# Patient Record
Sex: Male | Born: 1995 | Race: White | Hispanic: No | Marital: Single | Smoking: Never smoker
Health system: Southern US, Community
[De-identification: ages and names within clinical notes are randomized; demographics above are authoritative.]

## PROBLEM LIST (undated history)

## (undated) DIAGNOSIS — I861 Scrotal varices: Secondary | ICD-10-CM

## (undated) DIAGNOSIS — J45909 Unspecified asthma, uncomplicated: Secondary | ICD-10-CM

## (undated) HISTORY — DX: Scrotal varices: I86.1

## (undated) HISTORY — PX: WISDOM TOOTH EXTRACTION: SHX21

## (undated) HISTORY — DX: Unspecified asthma, uncomplicated: J45.909

## (undated) HISTORY — PX: OTHER SURGICAL HISTORY: SHX169

---

## 2015-08-02 ENCOUNTER — Encounter: Payer: Self-pay | Admitting: Urology

## 2015-08-02 ENCOUNTER — Ambulatory Visit (INDEPENDENT_AMBULATORY_CARE_PROVIDER_SITE_OTHER): Payer: BLUE CROSS/BLUE SHIELD | Admitting: Urology

## 2015-08-02 VITALS — BP 107/69 | HR 75 | Ht 65.0 in | Wt 121.6 lb

## 2015-08-02 DIAGNOSIS — N5082 Scrotal pain: Secondary | ICD-10-CM

## 2015-08-02 DIAGNOSIS — I861 Scrotal varices: Secondary | ICD-10-CM

## 2015-08-02 NOTE — Progress Notes (Signed)
08/02/2015 3:14 PM   Ralph Hardy 23-Jul-1995 [id number]  Referring provider: Dr. Noralee Stain  Chief Complaint  Patient presents with  . New Patient (Initial Visit)    Varicocele    HPI: Patient is a 20 year old Caucasian male who is referred to [place] by his PCP, Dr. Andrey Spearman, for a varicocele.    Patient states that he has had a right varicocele for a number of years. He states that he was initially told that he had a varicocele during a high school physical.  It has not caused him much difficulty until recently.  He is finding that the pain is occurring on a more frequent basis.  He has not noted any scrotal swelling. He states when he is walking for long periods of time or exercising, the pain is increased. On a few occasions, he is had to lay down for a few hours in his room until the pain abated.   He denies any penile discharge, dysuria, gross hematuria or suprapubic pain.  He is not having any urinary symptoms.  He does not have difficulty with erections.  He does not have pain or discomfort sedation of his semen upon ejaculation.    Recently, he states that he has been noting tenderness in the left scrotum.  PMH: Past Medical History  Diagnosis Date  . Asthma     Surgical History: Past Surgical History  Procedure Laterality Date  . Wisdom tooth extraction    . Cyst wrist      Home Medications:    Medication List    Notice  As of 08/02/2015  3:14 PM   You have not been prescribed any medications.      Allergies:  Allergies  Allergen Reactions  . Other Shortness Of Breath    Cat Dander  . Peanut Oil Rash    Family History: Family History  Problem Relation Age of Onset  . Hematuria Neg Hx   . Prostate cancer    . Sickle cell anemia Neg Hx     Social History:  reports that he has never smoked. He does not have any smokeless tobacco history on file. He reports that he drinks alcohol. He reports that he does not use illicit  drugs.  ROS: UROLOGY Frequent Urination?: No Hard to postpone urination?: No Burning/pain with urination?: No Get up at night to urinate?: No Leakage of urine?: No Urine stream starts and stops?: No Trouble starting stream?: No Do you have to strain to urinate?: No Blood in urine?: No Urinary tract infection?: No Sexually transmitted disease?: No Injury to kidneys or bladder?: No Painful intercourse?: No Weak stream?: No Erection problems?: No Penile pain?: No  Gastrointestinal Nausea?: No Vomiting?: No Indigestion/heartburn?: No Diarrhea?: No Constipation?: No  Constitutional Fever: No Night sweats?: No Weight loss?: No Fatigue?: No  Skin Skin rash/lesions?: No Itching?: No  Eyes Blurred vision?: No Double vision?: No  Ears/Nose/Throat Sore throat?: No Sinus problems?: No  Hematologic/Lymphatic Swollen glands?: No Easy bruising?: No  Cardiovascular Leg swelling?: No Chest pain?: No  Respiratory Cough?: No Shortness of breath?: No  Endocrine Excessive thirst?: No  Musculoskeletal Back pain?: No Joint pain?: No  Neurological Headaches?: No Dizziness?: No  Psychologic Depression?: No Anxiety?: No  Physical Exam: BP 107/69 mmHg  Pulse 75  Ht  (1.651 m)  Wt 121 lb 9.6 oz (55.157 kg)  BMI 20.24 kg/m2  Constitutional: Well nourished. Alert and oriented, No acute distress. HEENT: [place] AT, moist mucus membranes. Trachea midline,  no masses. Cardiovascular: No clubbing, cyanosis, or edema. Respiratory: Normal respiratory effort, no increased work of breathing. GI: Abdomen is soft, non tender, non distended, no abdominal masses. Liver and spleen not palpable.  No hernias appreciated.  Stool sample for occult testing is not indicated.   GU: No CVA tenderness.  No bladder fullness or masses.  Patient with circumcised phallus.   Urethral meatus is patent.  No penile discharge. No penile lesions or rashes. Scrotum without lesions, cysts,  rashes and/or edema.  Testicles are located scrotally bilaterally. No masses are appreciated in the testicles. Left and right epididymis are normal. Rectal: Deferred.   Skin: No rashes, bruises or suspicious lesions. Lymph: No cervical or inguinal adenopathy. Neurologic: Grossly intact, no focal deficits, moving all 4 extremities. Psychiatric: Normal mood and affect.   Assessment & Plan:    1. Scrotal pain   patient is having intermittent bilateral scrotal pain. We will obtain a scrotal ultrasound as his exam was fairly benign today for further examination of the scrotal contents. I will contact the patient with the results.  2. History of varicoceles:   We will schedule scrotal ultrasound to verify the presence of the varicoceles, as they were not obvious on today's exam.  If they are found to be fairly significant, he would like to pursue surgical removal of the varicoceles in the summer.  Return for I will call patient with results.  These notes generated with voice recognition software. I apologize for typographical errors.  Michiel CowboySHANNON Soriah Leeman, PA-C  [facility] [address], [address] [place], [place] [address] [phone]

## 2015-08-04 ENCOUNTER — Telehealth: Payer: Self-pay | Admitting: Urology

## 2015-08-04 DIAGNOSIS — N5082 Scrotal pain: Secondary | ICD-10-CM | POA: Insufficient documentation

## 2015-08-04 DIAGNOSIS — I861 Scrotal varices: Secondary | ICD-10-CM | POA: Insufficient documentation

## 2015-08-04 NOTE — Telephone Encounter (Signed)
Please send my note to Dr. Noralee StainGinette Archinal.

## 2015-08-05 NOTE — Telephone Encounter (Signed)
Done

## 2015-08-18 ENCOUNTER — Ambulatory Visit
Admission: RE | Admit: 2015-08-18 | Discharge: 2015-08-18 | Disposition: A | Discharge status: Home / Self Care | Payer: BLUE CROSS/BLUE SHIELD | Source: Ambulatory Visit | Attending: Urology | Admitting: Urology

## 2015-08-18 DIAGNOSIS — I861 Scrotal varices: Secondary | ICD-10-CM | POA: Diagnosis present

## 2015-08-24 ENCOUNTER — Telehealth: Payer: Self-pay | Admitting: Urology

## 2015-08-24 NOTE — Telephone Encounter (Signed)
Pt returned your call.  He also sent in a fax through nurse line.  I will put on your desk when I print out.  His # is [phone]

## 2015-08-25 ENCOUNTER — Telehealth: Payer: Self-pay

## 2015-08-25 NOTE — Telephone Encounter (Signed)
-----   Message from Shannon A McGoHarle Battiestwan, PA-C sent at 08/24/2015  7:23 AM EDT ----- I left word for the patient to call me back.  He has a left varicocele and before he has surgery for this, Dr. Apolinar JunesBrandon would like him to have an appointment with her.

## 2015-08-25 NOTE — Telephone Encounter (Signed)
Spoke with pt in reference to meeting with Dr. Apolinar JunesBrandon prior to surgery. Pt stated that he will have to call back in August to make the appt as [place] will be out of the [place] a month. Pt then stated that he is not in a hurry to get varicocele fixed. Reinforced with pt to call back when he arrives back in town. Pt voiced understanding.

## 2017-06-17 IMAGING — US US ART/VEN ABD/PELV/SCROTUM DOPPLER LTD
1 series · 14 of 25 positions shown · non-contrast
Comparison: None in PACs

CLINICAL DATA: Follow-up known varicocele ; increased discomfort
over the past 4 5 months

EXAM:
SCROTAL ULTRASOUND
DOPPLER ULTRASOUND OF THE TESTICLES
TECHNIQUE: Complete ultrasound examination of the testicles, epididymis, and
other scrotal structures was performed. Color and spectral Doppler
ultrasound were also utilized to evaluate blood flow to the
testicles.

[Series 1: us art/ven abd/pelv/scrotum doppler ltd · 0.08mm/px · 14 of 111 slices shown]
[im 1/111]
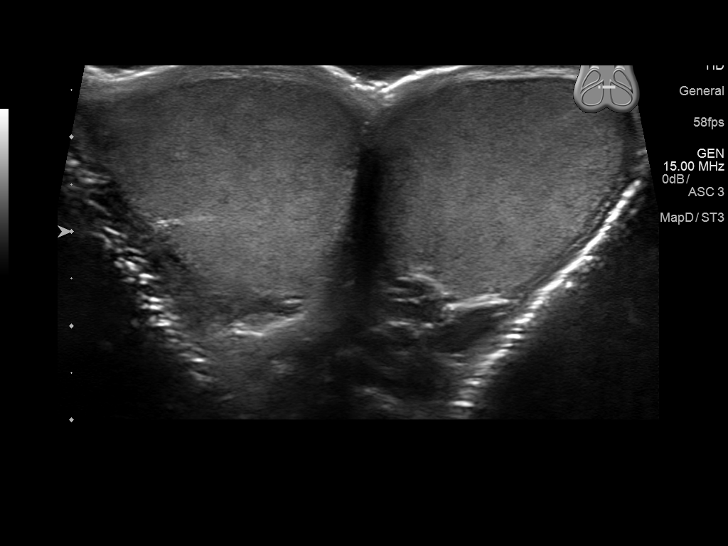
[im 10/111]
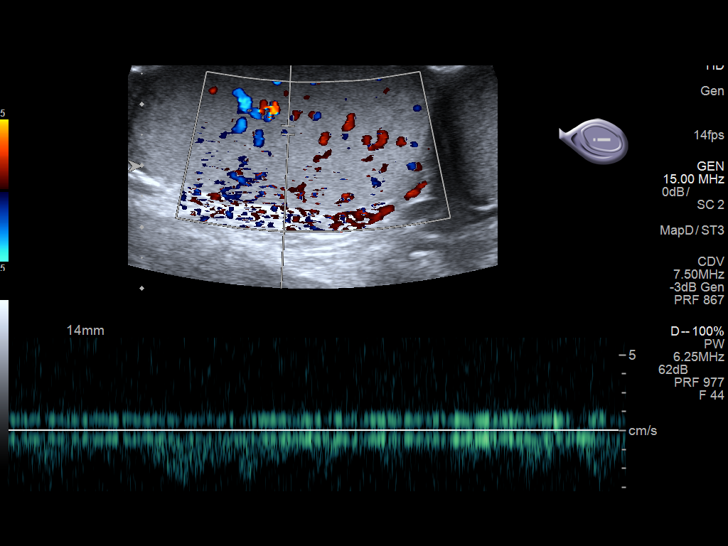
[im 19/111]
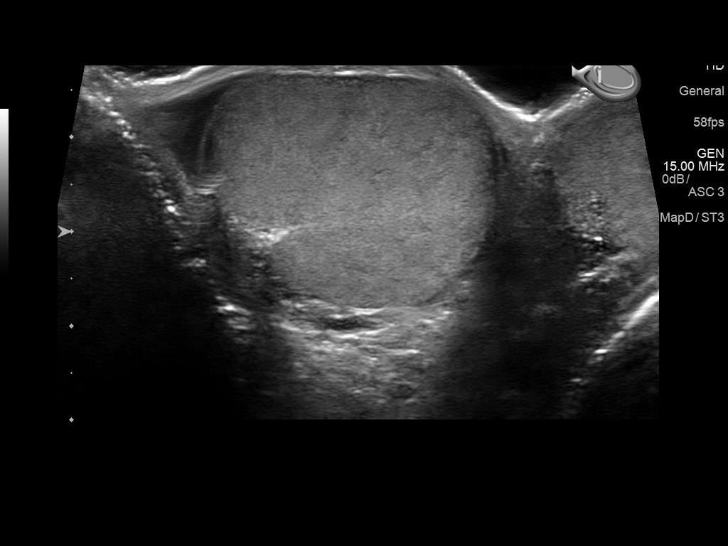
[im 28/111]
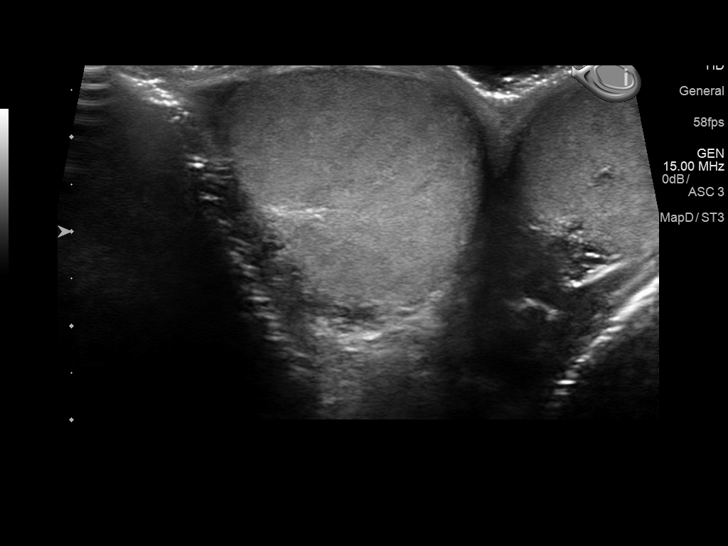
[im 37/111]
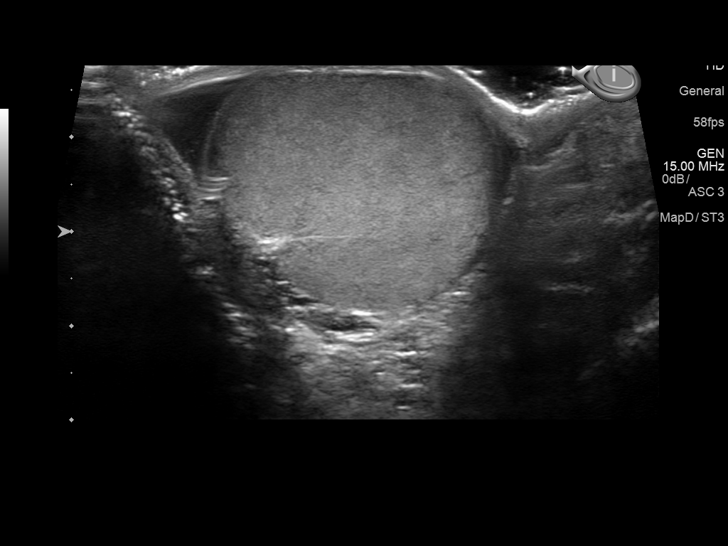
[im 42/111]
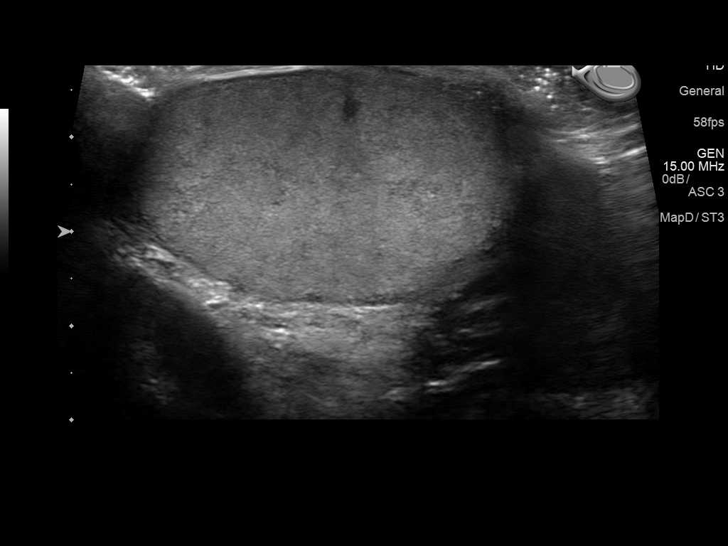
[im 51/111]
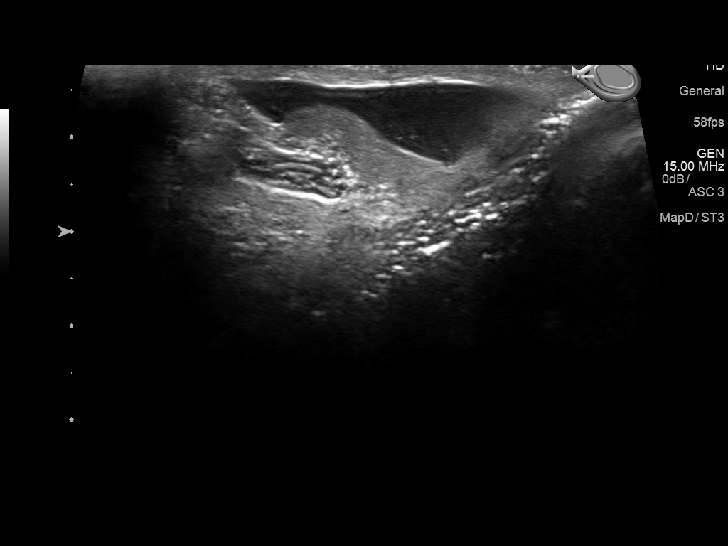
[im 60/111]
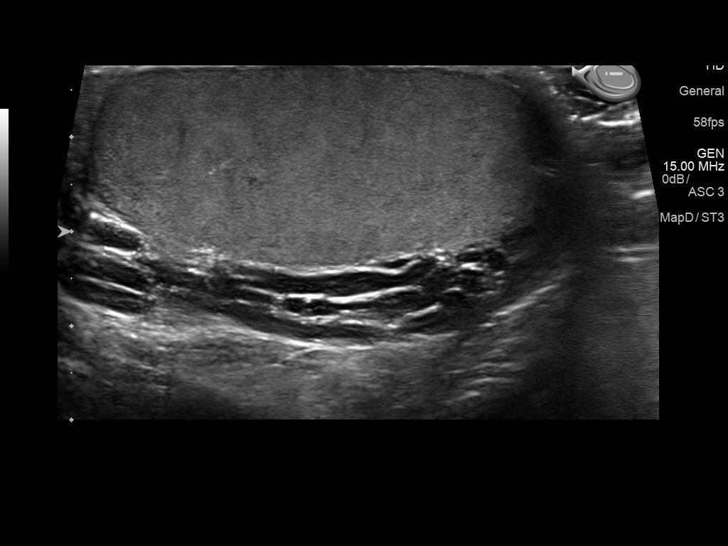
[im 69/111]
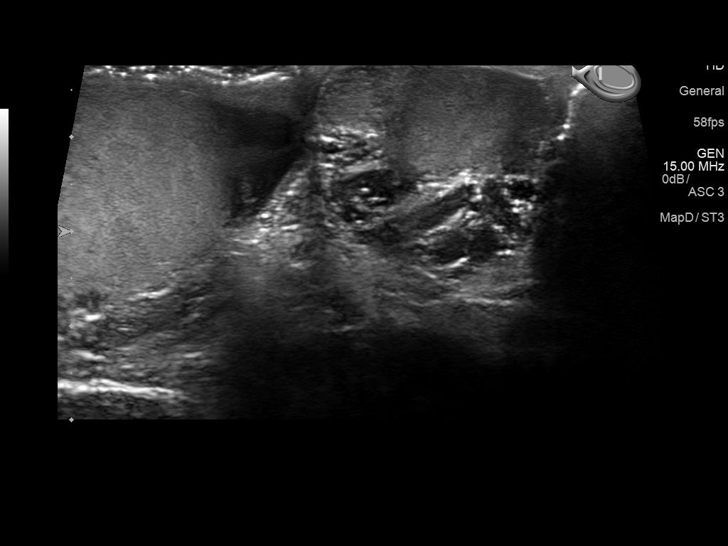
[im 74/111]
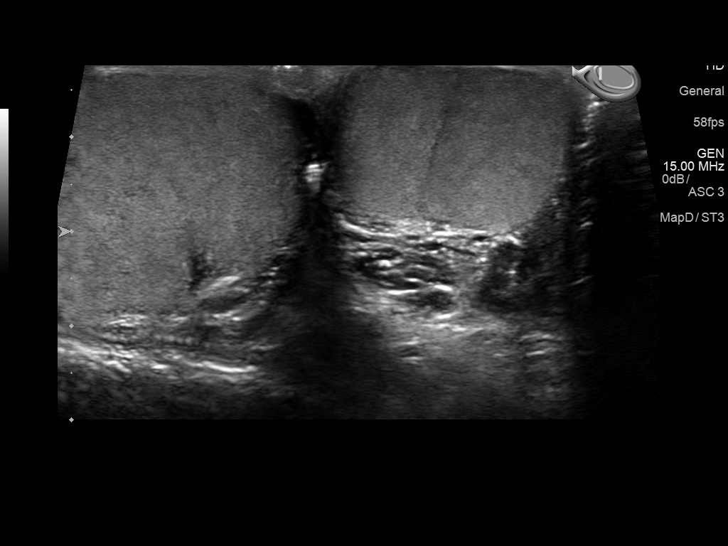
[im 83/111]
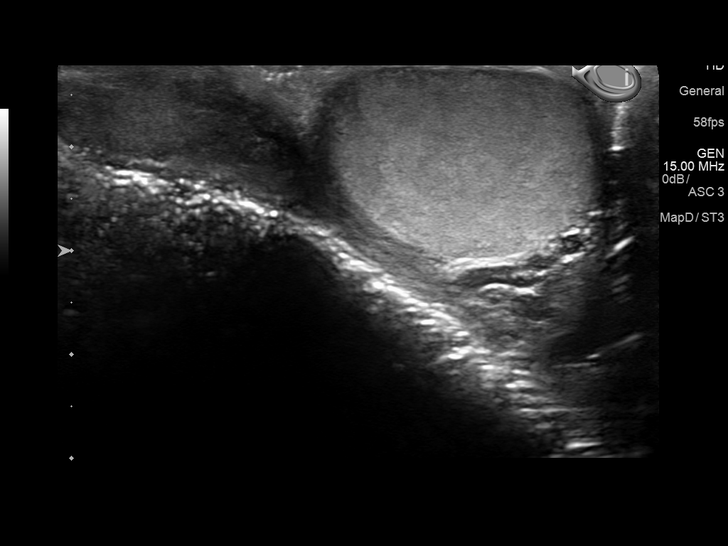
[im 92/111]
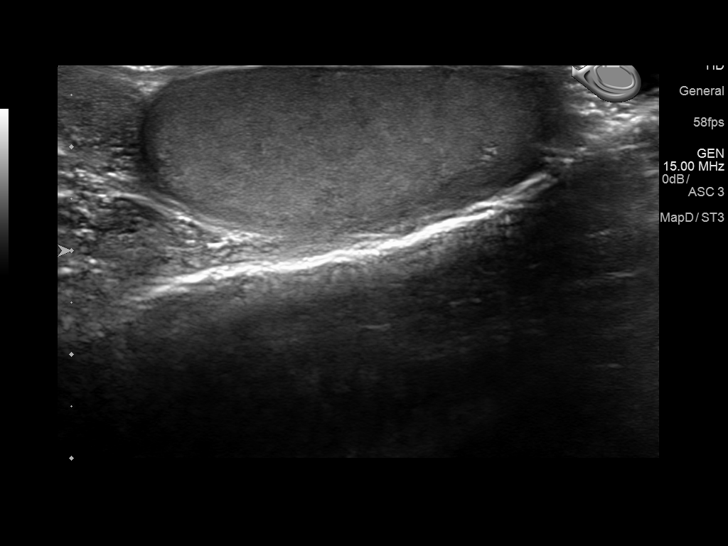
[im 101/111]
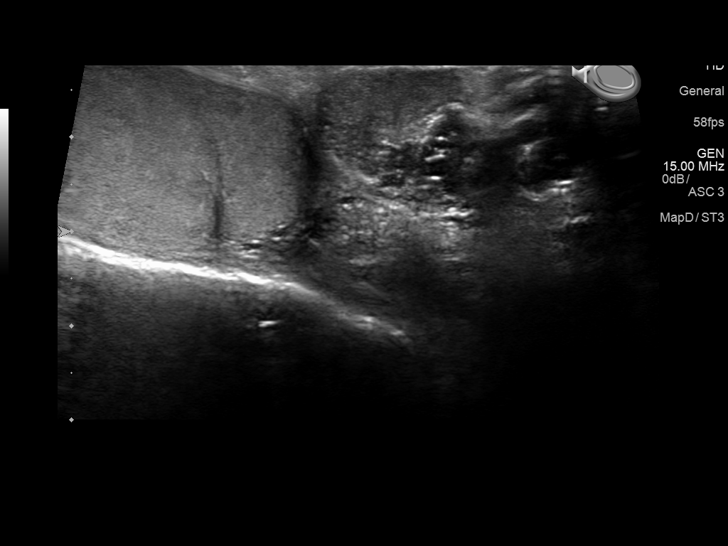
[im 111/111]
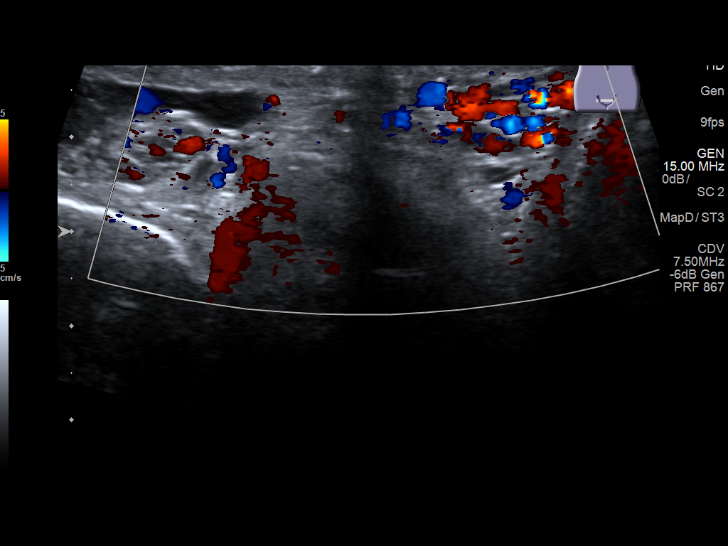

[14 of 25 positions shown; findings below may reference images not displayed]

FINDINGS: Right testicle

Measurements: 4.9 x 1.9 x 2.7 cm. No mass or microlithiasis
visualized.

Left testicle

Measurements: 4.9 x 2.5 x 3.2 cm. No mass or microlithiasis
visualized.

Right epididymis:  Normal in size and appearance.

Left epididymis:  Normal in size and appearance.

Hydrocele:  None visualized.

Varicocele:  There is a left-sided varicocele.

Pulsed Doppler interrogation of both testes demonstrates normal low
resistance arterial and venous waveforms bilaterally.
IMPRESSION: Left-sided varicocele. No hydrocele. No testicular or epididymal
mass. Vascularity of the testes appears normal.

## 2018-06-03 NOTE — Progress Notes (Addendum)
06/04/2018 11:07 AM   Ralph Hardy Mar 19, 1996 [id number]  Referring provider: Dr. Noralee Stain  Chief Complaint  Patient presents with  . Follow-up    Varicocele    HPI: Ralph Hardy is a 23 y.o. male Caucasian with a history of scrotal pain and left varicocele, who presents today for a follow up.  He was last seen at this clinic on 08/02/2015.  On the patient's 08/02/2015 visit, he stated that he has had a left varicocele for a number of years.  He stated that he was initially told that he had a varicocele during a high school physical.  It had not caused him much difficulty until recently.  He was finding that the pain was occurring on a more frequent basis.  He had not noted any scrotal swelling.  He stated when he was walking for long periods of time or exercising, the pain is increased.  On a few occasions, he has had to lay down for a few hours in his room until the pain abated.   He denied any penile discharge, dysuria, gross hematuria or suprapubic pain.  He was not having any urinary symptoms.  He did not have difficulty with erections.  He did not have pain or discomfort sedation of his semen upon ejaculation.  He stated at that time that recently, he had been noting tenderness in the left scrotum.  A scrotal ultrasound and pelvic doppler were performed on 08/18/2015, which reported the impression of a left-sided varicocele.  The patient was called with the results, and was then lost to follow-up.  Today (06/04/2018), the patient is complaining of right testicular pain.  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  Patient reports that no improvement with shifting position of his genitals.  Patient reports a loss of physical ability to maintain an erection, with trouble maintaining erection during oral intercourse or if there is a break in stimulation.  He reports that his right testicle feels firmer  after intercourse.  Patient denies pain on ejaculation.  Patient reports constant discomfort in his testicles.  Patient admits to still having spontaneous erections.  Patient denies painful or curved erections.  Patient does not remember having injured his penis.  Patient reports ejaculation seems normal.  Patient denies urinary symptoms.  Patient denies penile discharge.  Patient denies diarrhea and constipation.  Patient reports that his right testicle sometimes draws upwards during intercourse.  Patient admits having had to work his right testicle back into place.  Patient does not notice if his testicles have moved out of place when he has felt discomfort.  PMH: Past Medical History:  Diagnosis Date  . Asthma     Surgical History: Past Surgical History:  Procedure Laterality Date  . cyst wrist    . WISDOM TOOTH EXTRACTION      Home Medications:  Allergies as of 06/04/2018      Reactions   Other Shortness Of Breath   Cat Dander   Peanut Oil Rash      Medication List    as of June 04, 2018 11:07 AM   You have not been prescribed any medications.     Allergies:  Allergies  Allergen Reactions  . Other Shortness Of Breath    Cat Dander  . Peanut Oil Rash    Family History: Family History  Problem Relation Age of Onset  . Hematuria Neg Hx   .  Prostate cancer Unknown   . Sickle cell anemia Neg Hx     Social History:  reports that he has never smoked. He has never used smokeless tobacco. He reports current alcohol use. He reports that he does not use drugs.  ROS: UROLOGY Frequent Urination?: No Hard to postpone urination?: No Burning/pain with urination?: No Get up at night to urinate?: No Leakage of urine?: No Urine stream starts and stops?: No Trouble starting stream?: No Do you have to strain to urinate?: No Blood in urine?: No Urinary tract infection?: No Sexually transmitted disease?: No Injury to kidneys or bladder?: No Painful intercourse?: No Weak  stream?: No Erection problems?: No Penile pain?: No  Gastrointestinal Nausea?: No Vomiting?: No Indigestion/heartburn?: No Diarrhea?: No Constipation?: No  Constitutional Fever: No Night sweats?: No Weight loss?: No Fatigue?: No  Skin Skin rash/lesions?: No Itching?: No  Eyes Blurred vision?: No Double vision?: No  Ears/Nose/Throat Sore throat?: No Sinus problems?: No  Hematologic/Lymphatic Swollen glands?: No Easy bruising?: No  Cardiovascular Leg swelling?: No Chest pain?: No  Respiratory Cough?: No Shortness of breath?: No  Endocrine Excessive thirst?: No  Musculoskeletal Back pain?: No Joint pain?: No  Neurological Headaches?: No Dizziness?: No  Psychologic Depression?: No Anxiety?: No  Physical Exam: BP 99/64 (BP Location: Left Arm, Patient Position: Sitting, Cuff Size: Normal)   Pulse 64   Ht 5\' 5"  (1.651 m)   Wt 118 lb 1.6 oz (53.6 kg)   BMI 19.65 kg/m   Constitutional:  Well nourished. Alert and oriented, No acute distress. HEENT: [place] AT, moist mucus membranes.  Trachea midline. Cardiovascular: No clubbing, cyanosis, or edema. Respiratory: Normal respiratory effort, no increased work of breathing. GU: No CVA tenderness.  No bladder fullness or masses.  Patient with circumcised phallus.  Urethral meatus is patent.  No penile discharge. No penile lesions or rashes. Scrotum without lesions, cysts, rashes and/or edema.  Testicles are located scrotally bilaterally. No masses are appreciated in the testicles. Left and right epididymis are normal. I was able to push the right testicle up into the inguinal canal during exam.   Skin: No rashes, bruises or suspicious lesions. Lymph: No inguinal adenopathy. Neurologic: Grossly intact, no focal deficits, moving all 4 extremities. Psychiatric: Normal mood and affect.  Assessment & Plan:    1. Scrotal pain - Patient is having intermittent bilateral scrotal pain, right > left - We will obtain a  scrotal ultrasound as his exam was fairly benign today for further examination of the scrotal contents. - I will contact the patient with the results. - Discussed with patient the possibility that it could be surgically addressed, though any decisions ought to be made pending scrotal US results - Discussed with patient that it is possible that his right testicle might not be able to be manipulated back into place  2. History of varicoceles: - We will schedule scrotal ultrasound to verify the presence of the varicoceles, as they were not obvious on today's exam.   3. Erectile dysfunction  - Testosterone will be taken today  Return for pending labs and scrotal ultrasound .  Michiel Cowboy, PA-C   [facility] [address] [place], [place] [address] [phone]  I, Duanne Moron, am acting as a Neurosurgeon for [employer], PA-C.   I have reviewed the above documentation for accuracy and completeness, and I agree with the above.    Michiel Cowboy, PA-C

## 2018-06-04 ENCOUNTER — Ambulatory Visit: Payer: Managed Care, Other (non HMO) | Admitting: Urology

## 2018-06-04 ENCOUNTER — Encounter: Payer: Self-pay | Admitting: Urology

## 2018-06-04 ENCOUNTER — Other Ambulatory Visit: Payer: Self-pay

## 2018-06-04 VITALS — BP 99/64 | HR 64 | Ht 65.0 in | Wt 118.1 lb

## 2018-06-04 DIAGNOSIS — I861 Scrotal varices: Secondary | ICD-10-CM

## 2018-06-04 DIAGNOSIS — Z3009 Encounter for other general counseling and advice on contraception: Secondary | ICD-10-CM

## 2018-06-04 DIAGNOSIS — N529 Male erectile dysfunction, unspecified: Secondary | ICD-10-CM

## 2018-06-04 DIAGNOSIS — N5082 Scrotal pain: Secondary | ICD-10-CM

## 2018-06-05 ENCOUNTER — Telehealth: Payer: Self-pay

## 2018-06-05 LAB — TESTOSTERONE: Testosterone: 515 ng/dL (ref 264–916)

## 2018-06-05 NOTE — Telephone Encounter (Signed)
-----   Message from Harle Battiest, PA-C sent at 06/05/2018  7:50 AM EDT ----- Please let Ralph Hardy know that his testosterone level is normal.

## 2018-06-05 NOTE — Telephone Encounter (Signed)
Patient notified

## 2018-07-12 ENCOUNTER — Other Ambulatory Visit: Payer: Self-pay

## 2018-07-12 ENCOUNTER — Ambulatory Visit
Admission: RE | Admit: 2018-07-12 | Discharge: 2018-07-12 | Disposition: A | Discharge status: Home / Self Care | Payer: Managed Care, Other (non HMO) | Source: Ambulatory Visit | Attending: Urology | Admitting: Urology

## 2018-07-12 DIAGNOSIS — N5082 Scrotal pain: Secondary | ICD-10-CM | POA: Diagnosis present

## 2018-07-12 DIAGNOSIS — I861 Scrotal varices: Secondary | ICD-10-CM | POA: Diagnosis not present

## 2018-07-15 NOTE — Telephone Encounter (Signed)
LM TO CB TO CONFIRM APP

## 2018-07-15 NOTE — Telephone Encounter (Signed)
-----   Message from Harle Battiest, PA-C sent at 07/15/2018  8:06 AM EDT ----- Please schedule a virtual visit to go over results.

## 2018-07-17 ENCOUNTER — Telehealth (INDEPENDENT_AMBULATORY_CARE_PROVIDER_SITE_OTHER): Payer: Managed Care, Other (non HMO) | Admitting: Urology

## 2018-07-17 ENCOUNTER — Telehealth: Payer: Self-pay | Admitting: Urology

## 2018-07-17 ENCOUNTER — Other Ambulatory Visit: Payer: Self-pay

## 2018-07-17 DIAGNOSIS — N433 Hydrocele, unspecified: Secondary | ICD-10-CM

## 2018-07-17 DIAGNOSIS — I861 Scrotal varices: Secondary | ICD-10-CM

## 2018-07-17 NOTE — Telephone Encounter (Signed)
Would you call Devyon and have him schedule a follow up appointment in three months for recheck?

## 2018-07-19 NOTE — Progress Notes (Signed)
Virtual Visit via Telephone Note  I connected with Ralph Hardy on 07/16/2018 at 0911 by audio/visual meansand verified that I am speaking with the correct person using two identifiers.  They are located at his parent's home.  I am located at my home.    This visit type was conducted due to national recommendations for restrictions regarding the COVID-19 Pandemic (e.g. social distancing).  This format is felt to be most appropriate for this patient at this time.  All issues noted in this document were discussed and addressed.  No physical exam was performed.   I discussed the limitations, risks, security and privacy concerns of performing an evaluation and management service by telephone and the availability of in person appointments. I also discussed with the patient that there may be a patient responsible charge related to this service. The patient expressed understanding and agreed to proceed.   History of Present Illness: Ralph Hardy is a 23 year old Caucasian male with a history of a left varicocele and bilateral scrotal pain who is contacted today to discuss his scrotal ultrasound report.    Scrotal ultrasound on 07/12/2018 revealed normal testicles, small right hydrocele and an unchanged left varicocele.  Scrotal ultrasound on 08/18/2015 also noted the left varicocele.    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  He is also staying at his parent's home, so he has not been sexually active.  He states he has not had pain, but he is concerned that once he is back to his normal routines it will occur.      Observations/Objective: Ralph Hardy is resting comfortably and does not appear to be in distress.  He is well groomed and answering questions appropriately.    Assessment and Plan:  1. Left varicocele - will obtain a baseline semen analysis to assess for current fertility in three months - will continue conservative management at this  time - resting, supportive underwear, etc. - will notify office if symptoms worsen   2. Right hydrocele - explained what a hydrocele is and how they develop - explained most hydroceles do not need intervention unless they become large or painful  Follow Up Instructions:  [name] will follow up in three months for symptom recheck and semen analysis.     I discussed the assessment and treatment plan with the patient. The patient was provided an opportunity to ask questions and all were answered. The patient agreed with the plan and demonstrated an understanding of the instructions.   The patient was advised to call back or seek an in-person evaluation if the symptoms worsen or if the condition fails to improve as anticipated.  I provided 10 minutes of non-face-to-face time during this encounter.   Duy Lemming, PA-C

## 2018-07-30 ENCOUNTER — Telehealth: Payer: Self-pay | Admitting: Urology

## 2018-07-30 NOTE — Telephone Encounter (Signed)
Would you call [facility] and move his appointment up to late June?  He is relocating and wants to be seen before he moves. Thanks.

## 2018-09-04 ENCOUNTER — Ambulatory Visit: Payer: Managed Care, Other (non HMO) | Admitting: Urology

## 2018-09-17 ENCOUNTER — Ambulatory Visit: Payer: Managed Care, Other (non HMO) | Admitting: Urology

## 2018-10-16 ENCOUNTER — Ambulatory Visit: Payer: Managed Care, Other (non HMO) | Admitting: Urology

## 2019-07-25 ENCOUNTER — Telehealth (INDEPENDENT_AMBULATORY_CARE_PROVIDER_SITE_OTHER): Payer: Self-pay | Admitting: Nurse Practitioner

## 2019-07-25 ENCOUNTER — Encounter (INDEPENDENT_AMBULATORY_CARE_PROVIDER_SITE_OTHER): Payer: Self-pay | Admitting: Nurse Practitioner

## 2019-07-25 ENCOUNTER — Ambulatory Visit (INDEPENDENT_AMBULATORY_CARE_PROVIDER_SITE_OTHER): Payer: Commercial Managed Care - POS | Admitting: Nurse Practitioner

## 2019-07-25 VITALS — BP 130/78 | HR 85 | Temp 99.5°F | Resp 18 | Ht 65.0 in | Wt 120.0 lb

## 2019-07-25 DIAGNOSIS — N3001 Acute cystitis with hematuria: Secondary | ICD-10-CM

## 2019-07-25 DIAGNOSIS — R399 Unspecified symptoms and signs involving the genitourinary system: Secondary | ICD-10-CM

## 2019-07-25 LAB — POCT URINALYSIS AUTOMATED (IAH)
Bilirubin, UA POCT: NEGATIVE
Glucose, UA POCT: NEGATIVE
Ketones, UA POCT: NEGATIVE mg/dL
Nitrite, UA POCT: NEGATIVE
PH, UA POCT: 6 (ref 4.6–8)
Protein, UA POCT: 100 mg/dL — AB
Specific Gravity, UA POCT: 1.015 mg/dL (ref 1.001–1.035)
Urobilinogen, UA POCT: 0.2 mg/dL

## 2019-07-25 MED ORDER — CIPROFLOXACIN HCL 500 MG PO TABS
500.00 mg | ORAL_TABLET | Freq: Two times a day (BID) | ORAL | 0 refills | Status: AC
Start: 2019-07-25 — End: 2019-08-04

## 2019-07-25 NOTE — Progress Notes (Signed)
[facility]  PROGRESS NOTE     Patient: Jacob Wiggins   Date: 07/25/2019   MRN: [id number]       Jacob Wiggins is a 24 y.o. male      HISTORY     History obtained from: Patient    Chief Complaint   Patient presents with    Dysuria     c/o Dysuria since this morning. self TX with Hydration         HPI   24 year old male presents for concern of UTI. He developed sx this morning which include hematuria, dysuria, mild chills and penile discharge. Denies any urinary hesitancy or difficulty with urination, flu-like sx, pain in genitals, back, flank area or groin. Does note some mild pain to suprapubic area. Reports history of variocele and was previously followed by urology. He has recently relocated to the area from [facility]. and has not yet established care with a local urologist. States he is sexually active and has been in a monogamous relationship with the same person for the past 3 years. They do have unprotected sex but he states he is not concerned for STDs and declines STD testing today.   Review of Systems   Constitutional: Positive for chills. Negative for diaphoresis and fatigue.   Respiratory: Negative for cough, shortness of breath and wheezing.    Gastrointestinal: Positive for abdominal pain. Negative for abdominal distention, nausea and vomiting.        Mild suprapubic pain   Genitourinary: Positive for discharge, dysuria, frequency, hematuria and urgency. Negative for decreased urine volume, difficulty urinating, enuresis, flank pain and penile pain.        Penile discharge yellow/greenish started this morning   Musculoskeletal: Negative for back pain.   Skin: Negative.    Neurological: Negative for dizziness, weakness and headaches.   Psychiatric/Behavioral: The patient is not nervous/anxious.        History:    Pertinent Past Medical, Surgical, Family and Social History were reviewed.        Current Outpatient Medications:     ciprofloxacin (CIPRO) 500 MG tablet, Take 1 tablet (500 mg total) by mouth 2  (two) times daily for 10 days, Disp: 20 tablet, Rfl: 0    Allergies   Allergen Reactions    Other Shortness Of Breath    Peanut Oil Rash       Medications and Allergies reviewed.    PHYSICAL EXAM     Vitals:    07/25/19 1030   BP: 130/78   BP Site: Left arm   Patient Position: Sitting   Cuff Size: Medium   Pulse: 85   Resp: 18   Temp: 99.5 F (37.5 C)   TempSrc: Tympanic   SpO2: 98%   Weight: 54.4 kg (120 lb)   Height: 1.651 m (5\' 5" )       Physical Exam   Constitutional: He is oriented to person, place, and time. He appears well-developed and well-nourished. No distress.   HENT:   Head: Normocephalic and atraumatic.   Pulmonary/Chest: Effort normal. No respiratory distress.   Abdominal: Soft. He exhibits no distension. There is abdominal tenderness in the suprapubic area. There is no rebound, no guarding and no CVA tenderness.   Neurological: He is alert and oriented to person, place, and time.   Skin: Skin is warm and dry. He is not diaphoretic.   Psychiatric: He has a normal mood and affect.       UCC COURSE  LABS  The following POCT tests were ordered, reviewed and discussed with the patient/family.     Results     Procedure Component Value Units Date/Time    UA Clinitek (urine dipstick) [[id number]]  (Abnormal) Collected: 07/25/19 1051     Updated: 07/25/19 1053     Urine Color POCT Red     Urine Clarity POCT Cloudy     Glucose, UA POCT Negative     Bilirubin, UA POCT Negative     Ketones, UA POCT Negative mg/dL      Specific Gravity, UA POCT 1.015 mg/dL      Blood, UA POCT  Large     PH, UA POCT 6.0     Protein, UA POCT =100 mg/dL      Urobilinogen, UA POCT 0.2 mg/dL      Nitrite, UA POCT Negative     Urine Leukocytes POCT Small          X-Ray  The following X-ray studies were ordered, visualized and independently interpreted by me. Results were discussed with the patient/family.     No results found.    No current facility-administered medications for this visit.       PROCEDURES     Procedures  UA -  large blood, small leukocytes  Urine culture pending  MEDICAL DECISION MAKING     History, physical, labs/studies most consistent with cystitis as the diagnosis.    Chart Review:  Prior PCP, Specialist and/or ED notes reviewed today: Yes  Prior labs/images/studies reviewed today: Yes    Differential Diagnosis: cystitis, pyelonephritis, prostatitis, STD    Discussed potential diagnoses including STD and advised testing for chlamydia/gonorrhea due to penile discharge but he declined  ASSESSMENT     Encounter Diagnoses   Name Primary?    UTI symptoms Yes    Acute cystitis with hematuria             PLAN      PLAN:    Cipro 500 mg BID x 10 days - reviewed potential AEs including tendon rupture   Urine culture pending   Drink lots of fluids   Establish care with local urologist and f/u as soon as possible - referral to urology given to him   Reviewed alarm symptoms that would require urgent/emergent evaluation   All questions answered    Orders Placed This Encounter   Procedures    Urine culture (IL/LC/QU)    Ambulatory referral to Urology    UA Clinitek (urine dipstick)     Requested Prescriptions     Signed Prescriptions Disp Refills    ciprofloxacin (CIPRO) 500 MG tablet 20 tablet 0     Sig: Take 1 tablet (500 mg total) by mouth 2 (two) times daily for 10 days       Discussed results and diagnosis with patient/family.  Reviewed warning signs for worsening condition, as well as, indications for follow-up with primary care physician and return to urgent care clinic.   Patient/family expressed understanding of instructions.     An After Visit Summary was provided to the patient.

## 2019-07-25 NOTE — Patient Instructions (Addendum)
Bladder Infection,Male (Adult)    You have a bladder infection.  Urine is normally free of bacteria. But bacteria can get into the urinary tract from the skin around the rectum. Or it may travel in the blood from other parts of the body.  This is called a urinary tract infection (UTI). An infection can occur anywhere in the urinary tract. It could be in a kidney (pyelonephritis) or in the bladder (cystitis) and urethra (urethritis). The urethra is the tube that drains urine from the bladder through the tip of the penis.  The most common place for a UTI is in the bladder. This is called a bladder infection. Most bladder infections are easily treated. They are not serious unless the infection spreads up to the kidney.  The terms bladder infection, UTI, and cystitis are often used to describe the same thing. But they arent always the same. Cystitis is an inflammation of the bladder. The most common cause of cystitis is an infection.  Keep in mind:   Infections in the urine are called UTIs.   Cystitis is often caused by a UTI.   Not all UTIs and cases of cystitis are bladder infections.   Bladder infections are the most common type of cystitis.  Symptoms of a bladder infection  The infection causes inflammation in the urethra and bladder. This inflammation causes many of the symptoms. The most common symptoms of a bladder infection are:   Pain or burning when urinating   Having to go more often than normal   Feeling like you need to goright away   Only a small amount of urine comes out   Blood in urine   Discomfort in your belly (abdomen), often in the lower belly, above the pubic bone   Cloudy, strong, or bad-smelling urine   Unable to urinate (retention)   Urinary incontinence   Fever   Loss of appetite  Older adults may also feel confused.  Causes of a bladder infection  Bladder infections are not contagious. You can't get one from someone else, from a toilet seat, or from sharing a bath.  The  most common cause of bladder infections is bacteria from the bowels. The bacteria get onto the skin around the opening of the urethra. From there they can get into the urine and travel up to the bladder. This causes inflammation and an infection. This often happens because of:   An enlarged prostate   Poor cleaning of the genitals   Procedures that put a tube in your bladder, such as a Foley catheter   Bowel incontinence   Older age   Not emptying your bladder (the urine stays there, giving the bacteria a chance to grow)   Dehydration (this lets urine to stay in the bladder longer)   Constipation (this can cause the bowels to push on the bladder or urethra and keep the bladder from emptying)  Treatment  Bladder infections are treated with antibiotics. They often clear up quickly without complications. Treatment helps prevent a more serious kidney infection.  Medicines  Medicines can help in treating a bladder infection:   You may have been given phenazopyridine to ease burning when you urinate. It will cause your urine to be bright orange. It can stain clothing.   You may have been prescribed antibiotics. Take this medicine until you have finished it, even if you feel better. Taking all of the medicine will make sure the infection has cleared.  You can use acetaminophen or ibuprofen  for pain,fever, or discomfort, unless another medicine was prescribed. You can also alternate them, or use both together. They work differently and are a different class of medicines, so taking them together is not an overdose.If you have chronic liver or kidney disease, talk with your healthcare provider before using these medicines. Also talk with your provider if Myrtis Ser had a stomach ulcer or GI (gastrointestinal) bleeding or are taking blood thinner medicines.  Home care  Here are some guidelines to help you care for yourself at home:   Drink plenty of fluids, unless your healthcare provider told you not to. Fluids will  prevent dehydration and flush out your bladder.   Use good personal hygiene. Wipe from front to back after using the toilet, and clean your penis regularly. If you arent circumcised, retract the foreskin when cleaning.   Urinate more often, and dont try to hold it in for long periods of time, if possible.   Wear loose-fitting clothes and cotton underwear. Don't wear tight-fitting pants. This helps keep you clean and dry.   Change your diet to prevent constipation. This means eating more fresh foods and more fiber, and less junk and fatty foods.   Don't have sex until your symptoms are gone.   Don't have caffeine, alcohol, and spicy foods. These can irritate the bladder.  Follow-up care  Follow up with your healthcare provider, or as advised, if all symptoms have not cleared up in 5 days. It's important to keep your follow-up appointment. You can talk with your provider to see if you need more tests of the urinary tract.This is especially important if you have infections that keep coming back.  If a culture was done, you will be told if your treatment needs to be changed. If directed, you can callto find out the results.  If X-rays were taken, you will be told of any findings that may affect your care.  Call 911  Call 911 if any of these occur:   Trouble breathing   Trouble waking up   Feeling confused   Fainting or loss of consciousness   Fast heart rate  When to get medical advice  Call your healthcare provider right awayif any of these occur:   Fever of 100.68F (38C) or higher, or as directed by your provider   Your symptoms dont improve after 2 days of treatment   Back or b pain that gets worse   Repeated vomiting, or you arent able to keep medicine down   Weakness or dizziness  [employer] last reviewed this educational content on 12/18/2017   2000-2020 [employer], [place]. [address] [place], [place] [address]. All rights reserved. This information is not intended as a substitute  for professional medical care. Always follow your healthcare professional's instructions.        Ciprofloxacin tablets  Brand Name: Cipro  What is this medicine?  CIPROFLOXACIN (sip roe FLOX a sin) is a quinolone antibiotic. It is used to treat certain kinds of bacterial infections. It will not work for colds, flu, or other viral infections.  How should I use this medicine?  Take this medicine by mouth with a full glass of water. Follow the directions on the prescription label. You can take it with or without food. If it upsets your stomach, take it with food. Take your medicine at regular intervals. Do not take your medicine more often than directed. Take all of your medicine as directed even if you think you are better. Do  not skip doses or stop your medicine early.  Avoid antacids, aluminum, calcium, iron, magnesium, and zinc products for 6 hours before and 2 hours after taking a dose of this medicine.  A special MedGuide will be given to you by the pharmacist with each prescription and refill. Be sure to read this information carefully each time.  Talk to your pediatrician regarding the use of this medicine in children. Special care may be needed.  What side effects may I notice from receiving this medicine?  Side effects that you should report to your doctor or health care professional as soon as possible:   allergic reactions like skin rash or hives, swelling of the face, lips, or tongue   anxious   bloody or watery diarrhea   confusion   depressed mood   fast, irregular heartbeat   fever   hallucination, loss of contact with reality   joint, muscle, or tendon pain or swelling   loss of memory   pain, tingling, numbness in the hands or feet   seizures   signs and symptoms of aortic dissection such as sudden chest, stomach, or back pain   signs and symptoms of high blood sugar such as dizziness; dry mouth; dry skin; fruity breath; nausea; stomach pain; increased hunger or thirst; increased  urination   signs and symptoms of liver injury like dark yellow or brown urine; general ill feeling or flu-like symptoms; light-colored stools; loss of appetite; nausea; right upper belly pain; unusually weak or tired; yellowing of the eyes or skin   signs and symptoms of low blood sugar such as feeling anxious; confusion; dizziness; increased hunger; unusually weak or tired; sweating; shakiness; cold; irritable; headache; blurred vision; fast heartbeat; loss of consciousness; pale skin   suicidal thoughts or other mood changes   sunburn   unusually weak or tired  Side effects that usually do not require medical attention (report to your doctor or health care professional if they continue or are bothersome):   dry mouth   headache   nausea   trouble sleeping  What may interact with this medicine?  Do not take this medicine with any of the following medications:   cisapride   dofetilide   dronedarone   flibanserin   lomitapide   pimozide   thioridazine   tizanidine   ziprasidone  This medicine may also interact with the following medications:   antacids   birth control pills   caffeine   certain medicines for diabetes, like glipizide, glyburide, or insulin   certain medicines that treat or prevent blood clots like warfarin   clozapine   cyclosporine   didanosine buffered tablets or powder   duloxetine   lanthanum carbonate   lidocaine   methotrexate   multivitamins   NSAIDS, medicines for pain and inflammation, like ibuprofen or naproxen   olanzapine   omeprazole   other medicines that prolong the QT interval (cause an abnormal heart rhythm)   phenytoin   probenecid   ropinirole   sevelamer   sildenafil   sucralfate   theophylline   zolpidem  What if I miss a dose?  If you miss a dose, take it as soon as you can. If it is almost time for your next dose, take only that dose. Do not take double or extra doses.  Where should I keep my medicine?  Keep out of the reach of  children.  Store at room temperature below 30 degrees C (86 degrees F). Keep container tightly  closed. Throw away any unused medicine after the expiration date.  What should I tell my health care provider before I take this medicine?  They need to know if you have any of these conditions:   bone problems   diabetes   heart disease   high blood pressure   history of irregular heartbeat   history of low levels of potassium in the blood   joint problems   kidney disease   liver disease   mental illness   myasthenia gravis   seizures   tendon problems   tingling of the fingers or toes, or other nerve disorder   an unusual or allergic reaction to ciprofloxacin, other antibiotics or medicines, foods, dyes, or preservatives   pregnant or trying to get pregnant   breast-feeding  What should I watch for while using this medicine?  Tell your doctor or healthcare professional if your symptoms do not start to get better or if they get worse.  Do not treat diarrhea with over the counter products. Contact your doctor if you have diarrhea that lasts more than 2 days or if it is severe and watery.  Check with your doctor or health care professional if you get an attack of severe diarrhea, nausea and vomiting, or if you sweat a lot. The loss of too much body fluid can make it dangerous for you to take this medicine.  This medicine may affect blood sugar levels. If you have diabetes, check with your doctor or health care professional before you change your diet or the dose of your diabetic medicine.  You may get drowsy or dizzy. Do not drive, use machinery, or do anything that needs mental alertness until you know how this medicine affects you. Do not sit or stand up quickly, especially if you are an older patient. This reduces the risk of dizzy or fainting spells.  This medicine can make you more sensitive to the sun. Keep out of the sun. If you cannot avoid being in the sun, wear protective clothing and use a  sunscreen. Do not use sun lamps or tanning beds/booths.  NOTE:This sheet is a summary. It may not cover all possible information. If you have questions about this medicine, talk to your doctor, pharmacist, or health care provider. Copyright 2020 [employer]

## 2019-07-25 NOTE — Telephone Encounter (Signed)
Patient called and states he is having some mild bilateral lower back pain that he noticed after taking a nap today. He was concerned this may be side effect of Cipro and wanted to know if he should continue. 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 Denies any fever, chills, malaise, N/V, flank pain. Advised to go to ED while in [place] if he develops worsening symptoms as above. F/u with urology once he returns to [place] next week.

## 2020-05-11 IMAGING — US ULTRASOUND SCROTUM DOPPLER COMPLETE
1 series · 14 of 25 positions shown · non-contrast
Comparison: 08/18/2015

CLINICAL DATA: 23-year-old male with chronic RIGHT scrotal pain for
several years. History of LEFT varicocele.

EXAM:
SCROTAL ULTRASOUND
DOPPLER ULTRASOUND OF THE TESTICLES
TECHNIQUE: Complete ultrasound examination of the testicles, epididymis, and
other scrotal structures was performed. Color and spectral Doppler
ultrasound were also utilized to evaluate blood flow to the
testicles.

[Series 1: ultrasound scrotum doppler complete · 0.07mm/px · 14 of 67 slices shown]
[im 1/67]
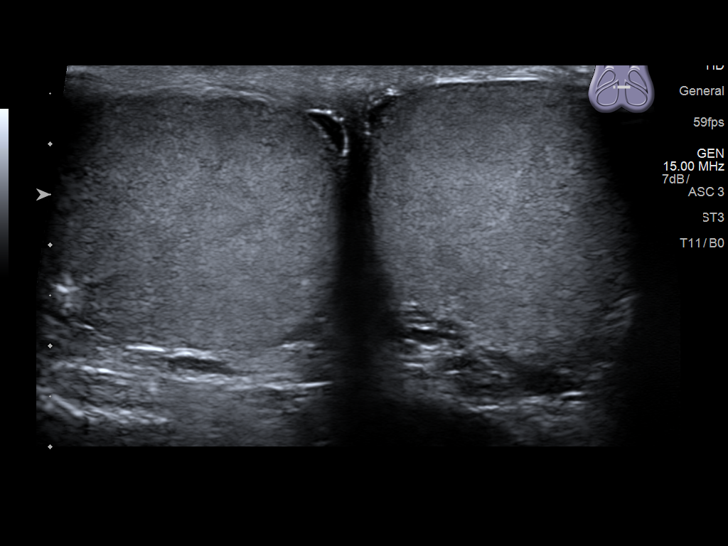
[im 6/67]
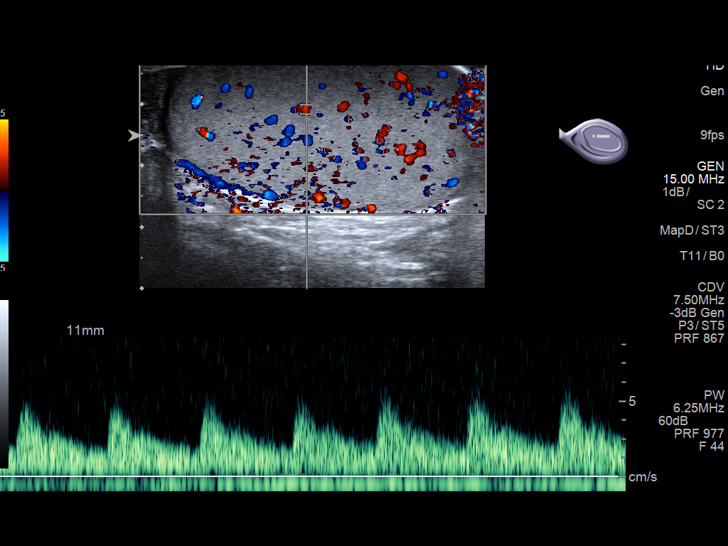
[im 12/67]
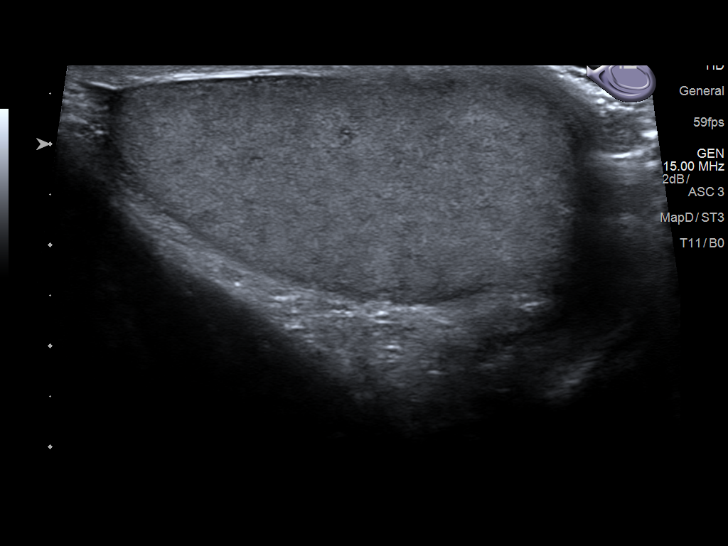
[im 17/67]
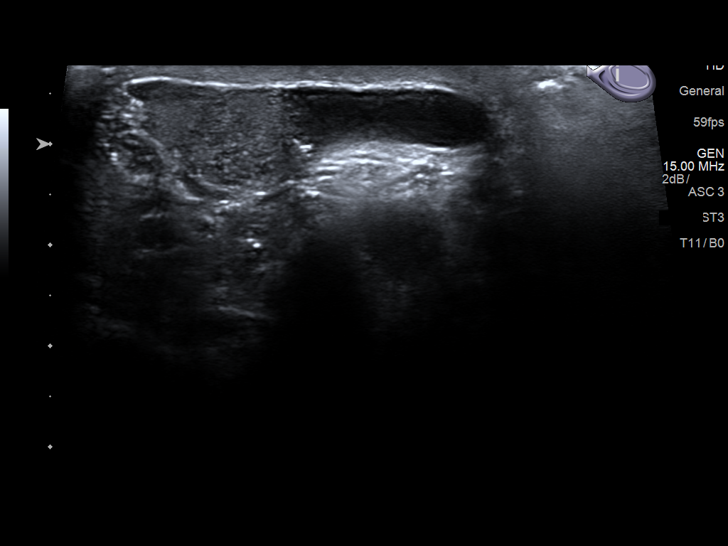
[im 23/67]
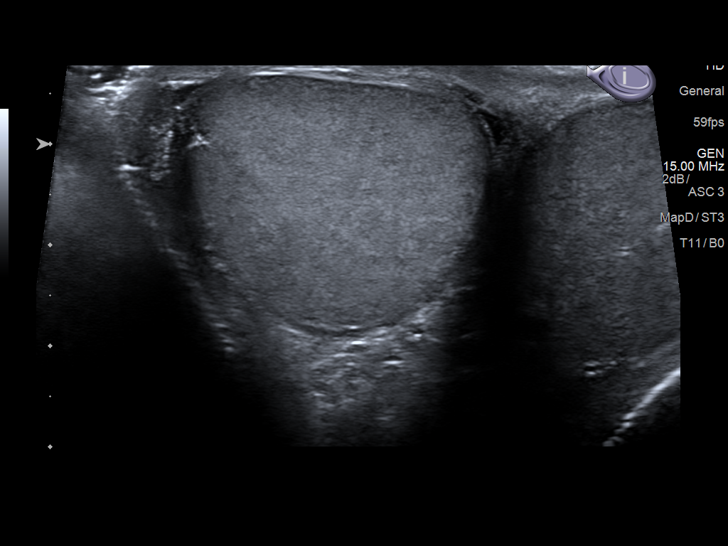
[im 25/67]
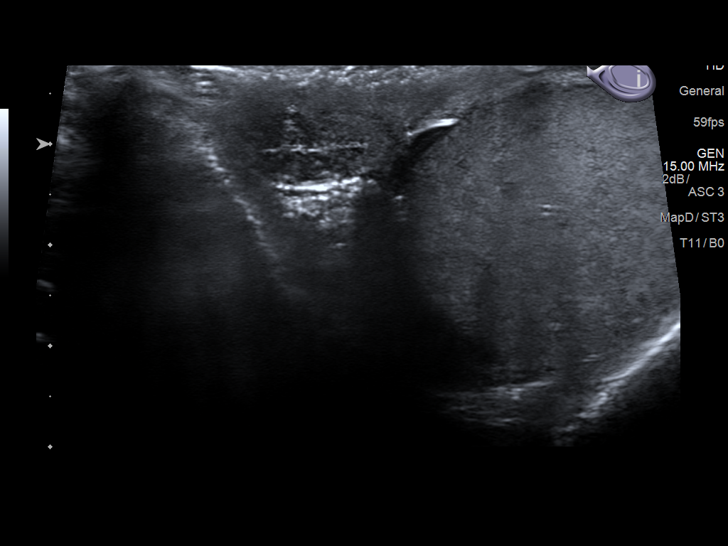
[im 31/67]
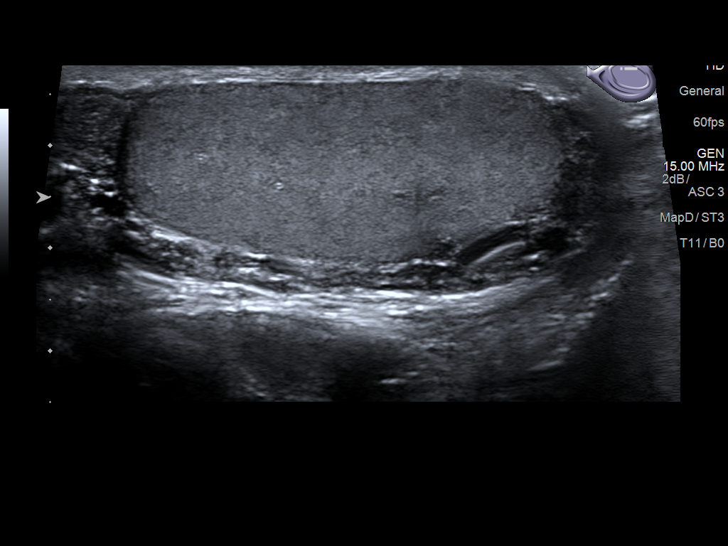
[im 36/67]
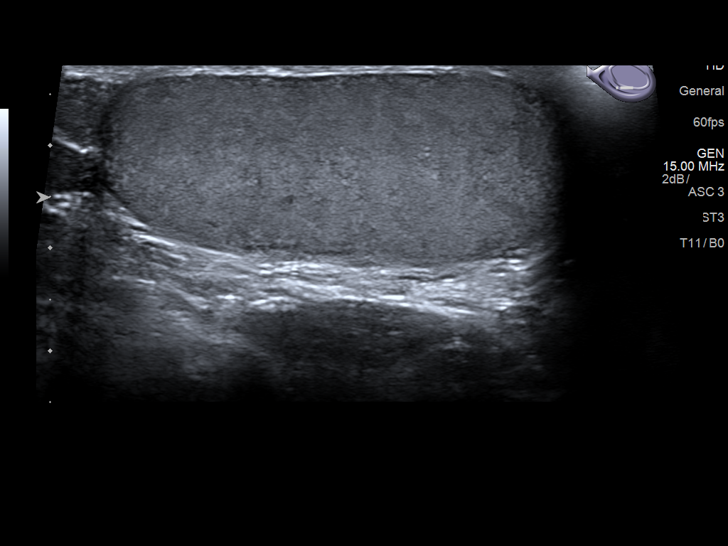
[im 42/67]
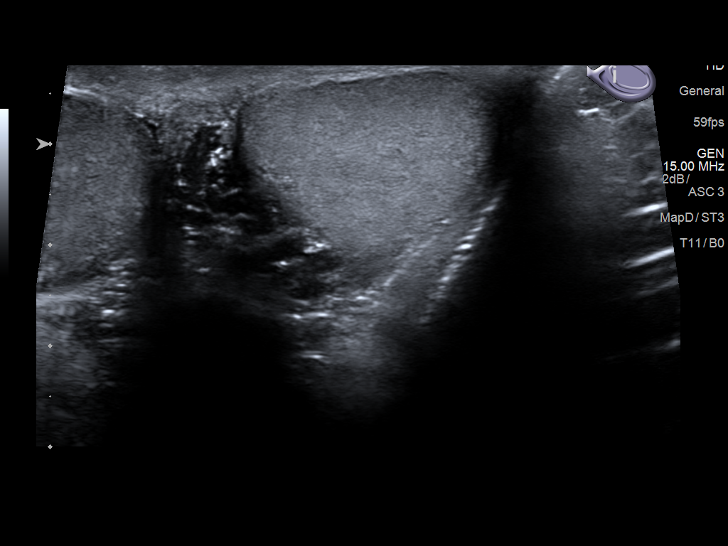
[im 45/67]
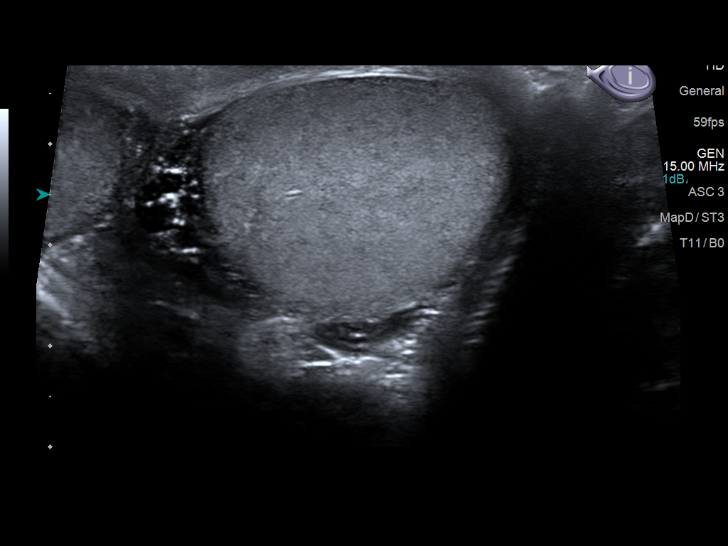
[im 50/67]
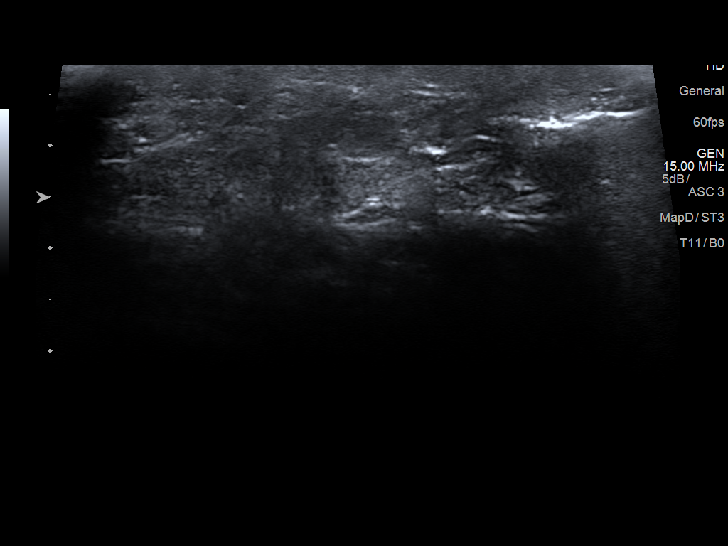
[im 56/67]
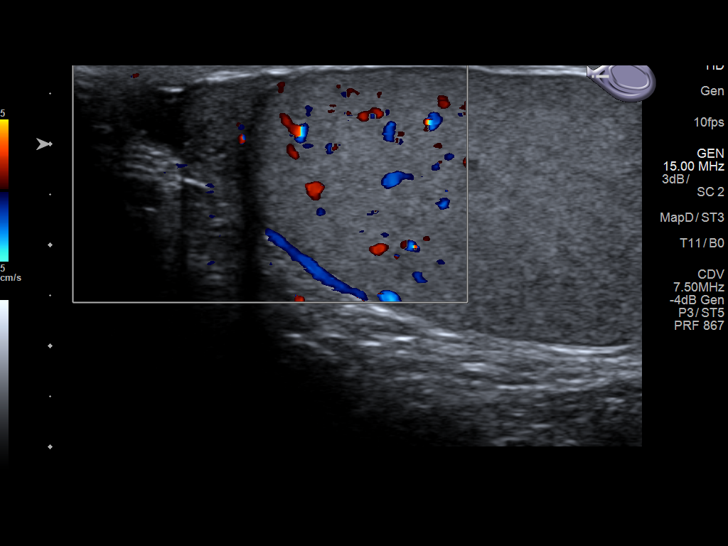
[im 61/67]
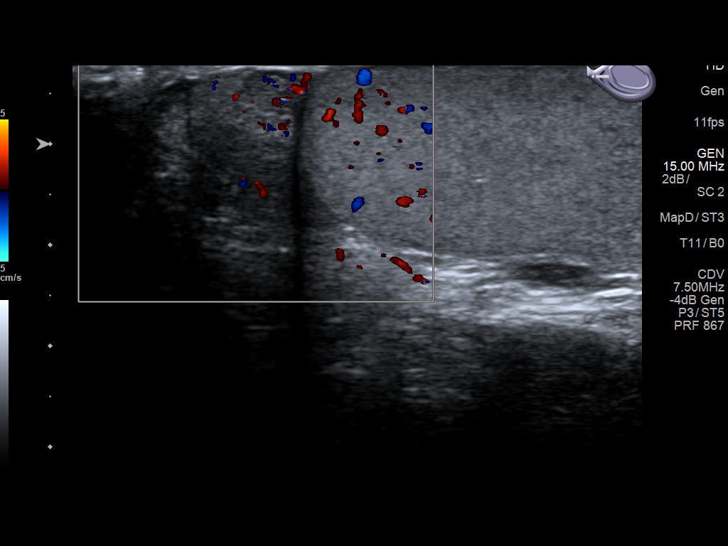
[im 67/67]
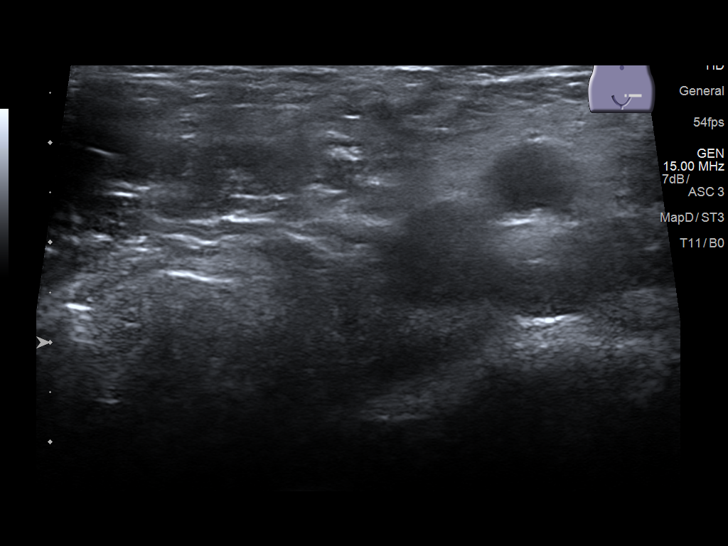

[14 of 25 positions shown; findings below may reference images not displayed]

FINDINGS: Right testicle

Measurements: 5 x 2.3 x 3.1 cm. No mass or microlithiasis
visualized.

Left testicle

Measurements: 4.8 x 2.1 x 2.9 cm. No mass or microlithiasis
visualized.

Right epididymis:  Normal in size and appearance.

Left epididymis:  Normal in size and appearance.

Hydrocele:  A small RIGHT hydrocele is noted.

Varicocele: A LEFT varicocele is again noted. No evidence of RIGHT
varicocele.

Pulsed Doppler interrogation of both testes demonstrates normal low
resistance arterial and venous waveforms bilaterally.
IMPRESSION: 1. Normal testicles.
2. Small RIGHT hydrocele.
3. Unchanged LEFT varicocele.
